# Patient Record
Sex: Male | Born: 1965 | Race: White | Hispanic: No | Marital: Married | Smoking: Never smoker
Health system: Southern US, Community
[De-identification: ages and names within clinical notes are randomized; demographics above are authoritative.]

## PROBLEM LIST (undated history)

## (undated) DIAGNOSIS — M109 Gout, unspecified: Secondary | ICD-10-CM

## (undated) DIAGNOSIS — M2550 Pain in unspecified joint: Secondary | ICD-10-CM

## (undated) HISTORY — DX: Pain in unspecified joint: M25.50

## (undated) HISTORY — PX: HEMORROIDECTOMY: SUR656

## (undated) HISTORY — PX: LITHOTRIPSY: SUR834

## (undated) HISTORY — DX: Gout, unspecified: M10.9

---

## 1999-11-18 ENCOUNTER — Emergency Department (HOSPITAL_COMMUNITY): Admission: EM | Admit: 1999-11-18 | Discharge: 1999-11-18 | Payer: Self-pay | Admitting: *Deleted

## 1999-11-18 ENCOUNTER — Encounter: Payer: Self-pay | Admitting: *Deleted

## 2000-07-01 ENCOUNTER — Encounter: Payer: Self-pay | Admitting: Neurosurgery

## 2000-07-01 ENCOUNTER — Encounter: Admission: RE | Admit: 2000-07-01 | Discharge: 2000-07-01 | Payer: Self-pay | Admitting: Neurosurgery

## 2004-02-11 ENCOUNTER — Ambulatory Visit (HOSPITAL_COMMUNITY): Admission: RE | Admit: 2004-02-11 | Discharge: 2004-02-11 | Payer: Self-pay | Admitting: Urology

## 2005-10-08 ENCOUNTER — Encounter: Admission: RE | Admit: 2005-10-08 | Discharge: 2005-10-08 | Payer: Self-pay | Admitting: Family Medicine

## 2006-09-16 ENCOUNTER — Encounter: Admission: RE | Admit: 2006-09-16 | Discharge: 2006-09-16 | Payer: Self-pay | Admitting: Family Medicine

## 2006-09-16 ENCOUNTER — Ambulatory Visit: Payer: Self-pay | Admitting: Family Medicine

## 2007-03-04 ENCOUNTER — Ambulatory Visit: Payer: Self-pay | Admitting: Family Medicine

## 2007-10-11 ENCOUNTER — Ambulatory Visit: Payer: Self-pay | Admitting: Family Medicine

## 2008-02-06 ENCOUNTER — Ambulatory Visit: Payer: Self-pay | Admitting: Family Medicine

## 2008-03-12 ENCOUNTER — Ambulatory Visit: Payer: Self-pay | Admitting: Family Medicine

## 2008-09-06 ENCOUNTER — Ambulatory Visit: Payer: Self-pay | Admitting: Family Medicine

## 2009-02-03 ENCOUNTER — Emergency Department (HOSPITAL_COMMUNITY): Admission: EM | Admit: 2009-02-03 | Discharge: 2009-02-03 | Payer: Self-pay | Admitting: Emergency Medicine

## 2010-05-29 ENCOUNTER — Encounter: Admission: RE | Admit: 2010-05-29 | Discharge: 2010-05-29 | Payer: Self-pay | Admitting: Internal Medicine

## 2011-04-01 IMAGING — CR DG FOOT COMPLETE 3+V*L*
3 series · 3 of 3 positions shown · non-contrast
Comparison: None.

CLINICAL DATA: Medial forefoot pain following injury 10 days ago.

LEFT FOOT - COMPLETE 3+ VIEW

[view not recorded (1 of 3)]
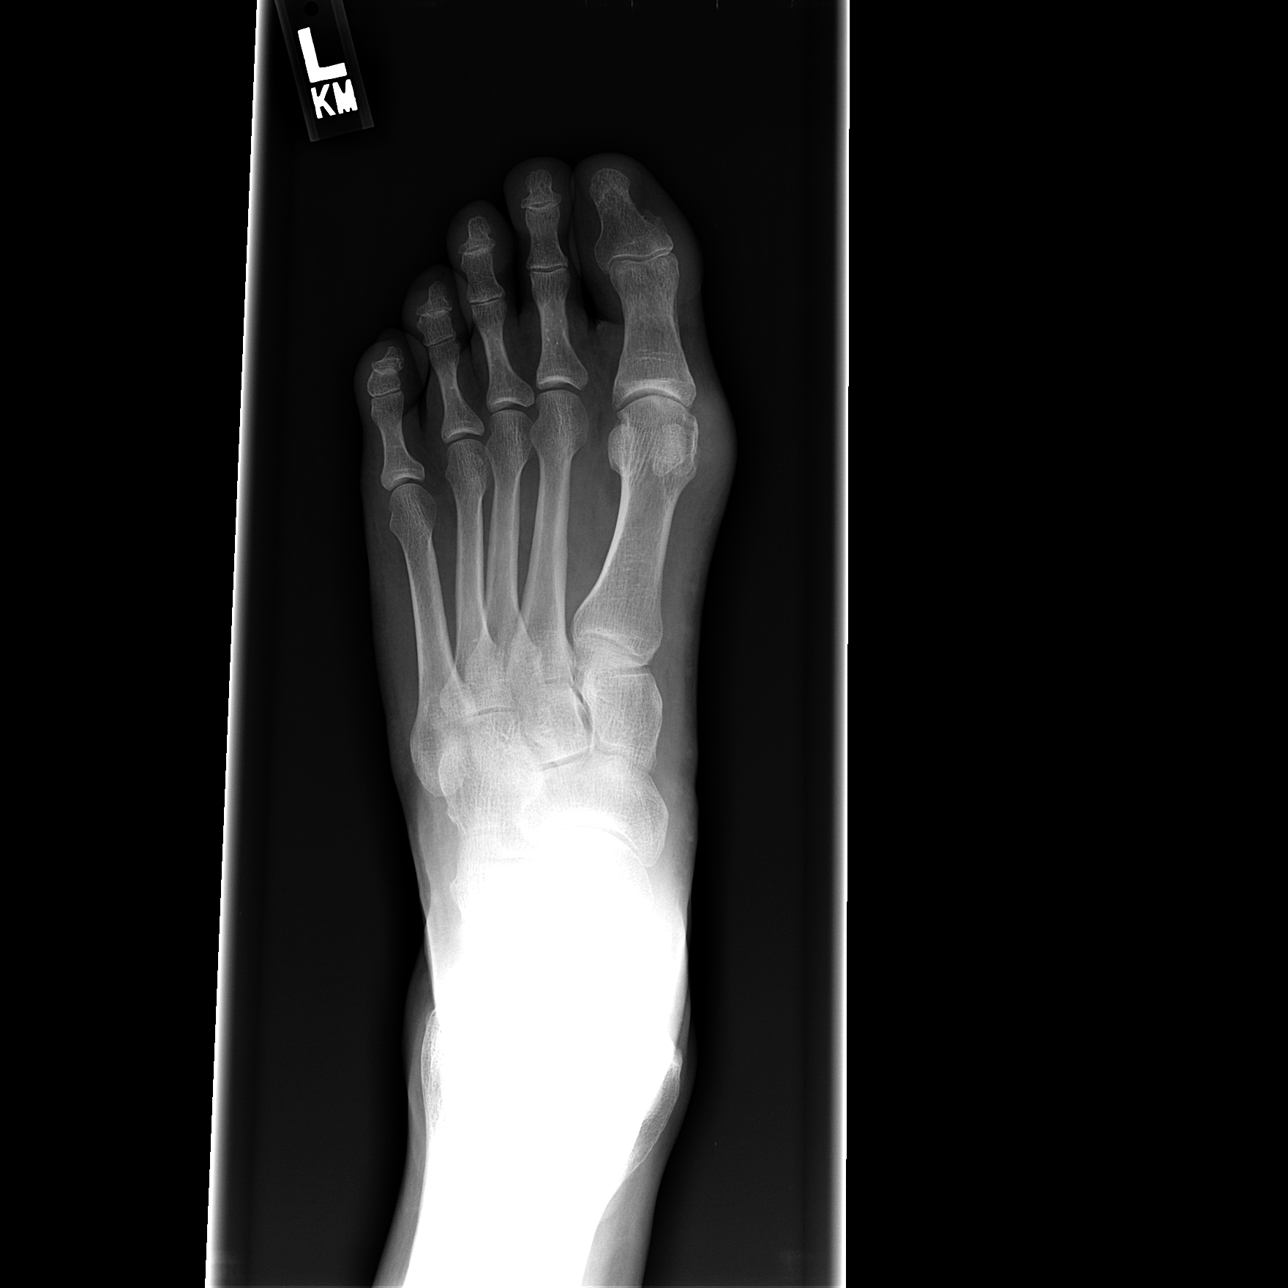

[view not recorded (2 of 3)]
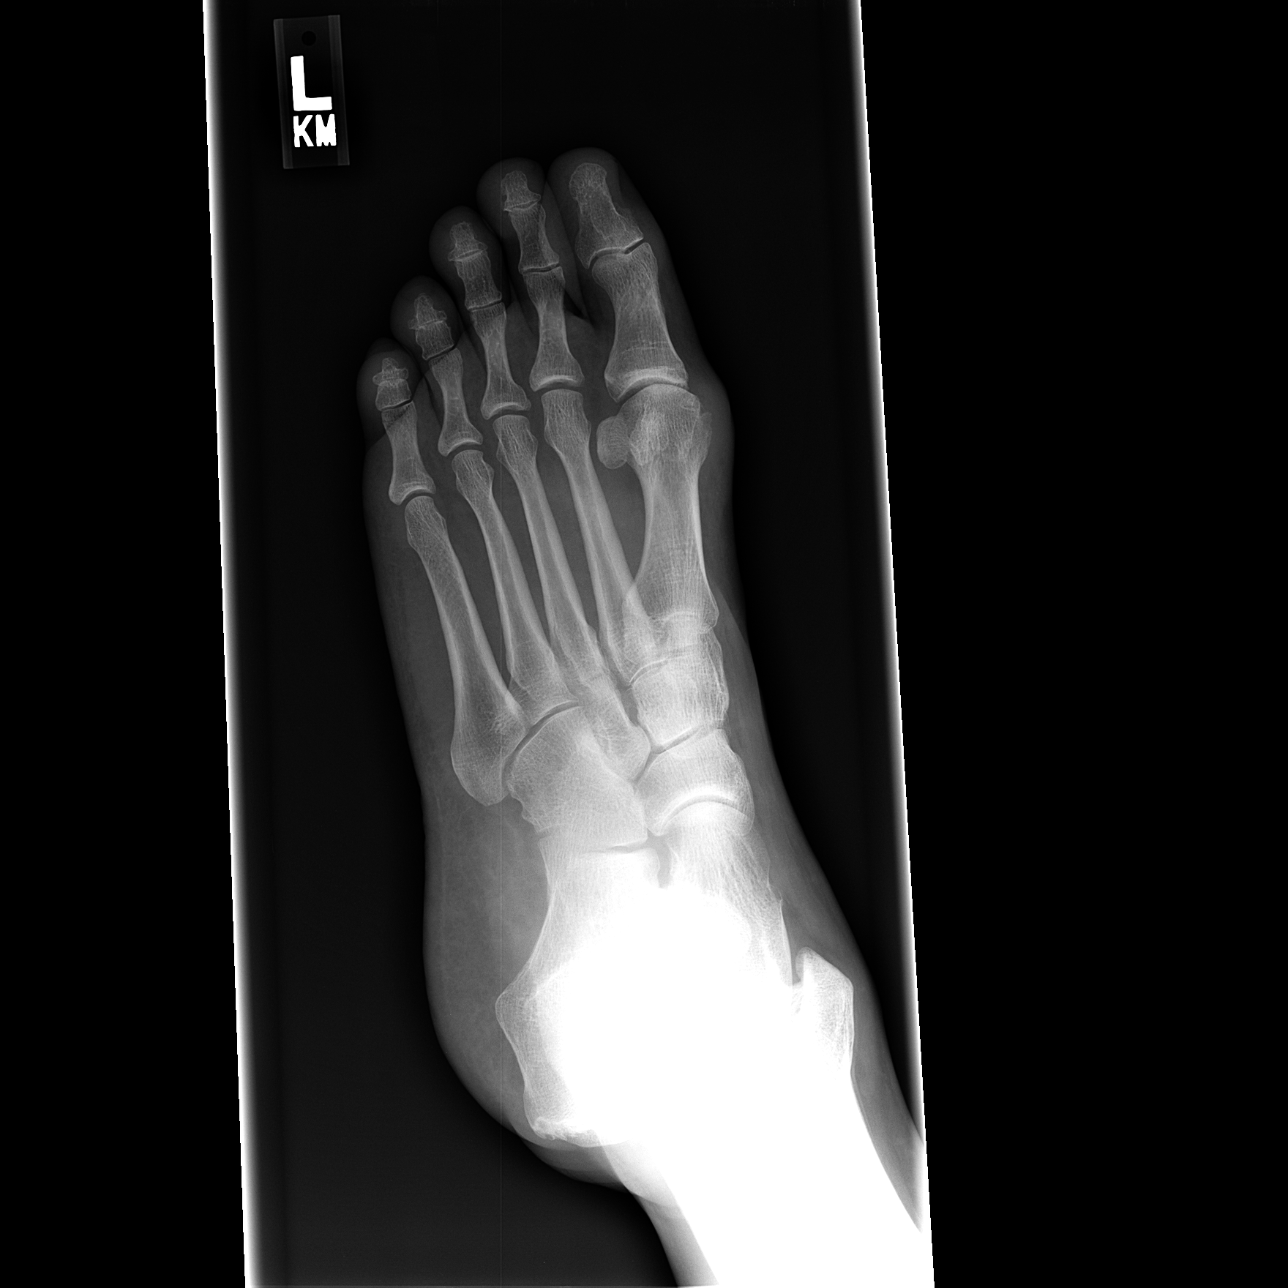

[view not recorded (3 of 3)]
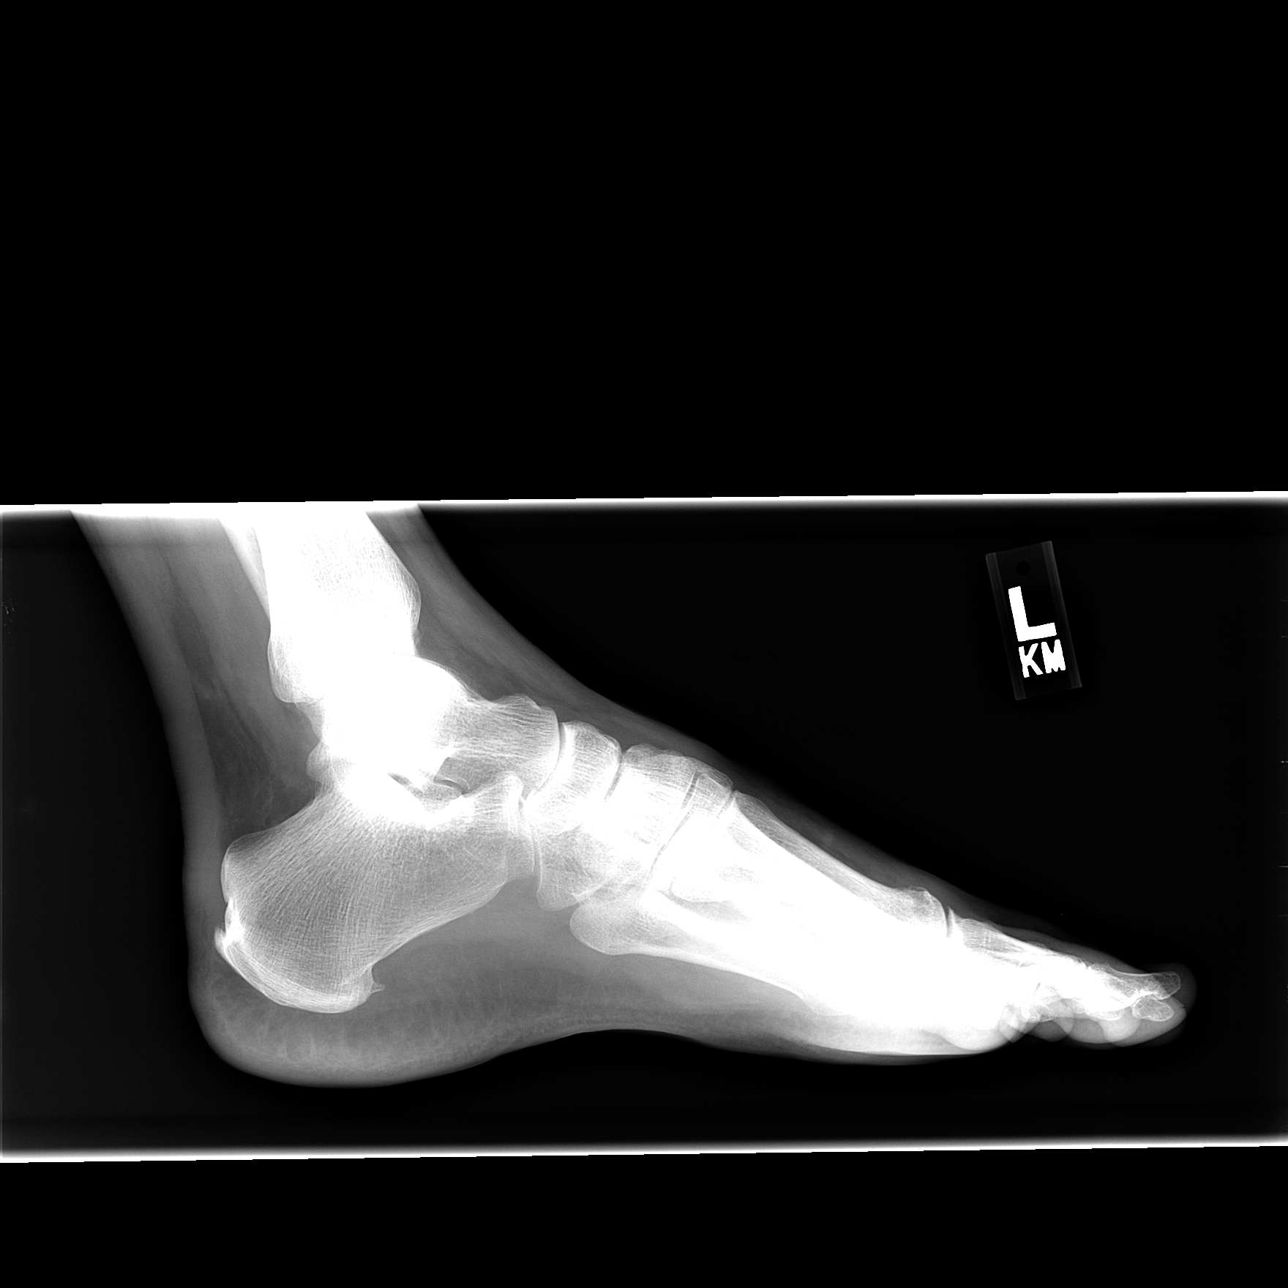

[3 of 3 positions shown; findings below may reference images not displayed]

FINDINGS: There is some soft tissue swelling medial to the first
metatarsal phalangeal joint.  That joint demonstrates mild
narrowing and osteophytes.  There is no evidence of acute fracture
or dislocation.  There are small calcaneal spurs.
IMPRESSION: Degenerative changes at the first metatarsal phalangeal joint with
possible overlying swelling or bunion.  No acute osseous findings.

## 2013-02-03 LAB — BASIC METABOLIC PANEL
BUN: 17 mg/dL (ref 4–21)
Creatinine: 1 mg/dL (ref 0.6–1.3)
Glucose: 94 mg/dL
POTASSIUM: 6.8 mmol/L — AB (ref 3.4–5.3)
Sodium: 142 mmol/L (ref 137–147)

## 2013-02-03 LAB — HEPATIC FUNCTION PANEL
ALT: 26 U/L (ref 10–40)
AST: 17 U/L (ref 14–40)
Alkaline Phosphatase: 64 U/L (ref 25–125)
Bilirubin, Total: 0.6 mg/dL

## 2013-02-03 LAB — CBC AND DIFFERENTIAL
HEMATOCRIT: 40 % — AB (ref 41–53)
Hemoglobin: 14.3 g/dL (ref 13.5–17.5)
PLATELETS: 234 10*3/uL (ref 150–399)
WBC: 7.6 10*3/mL

## 2013-02-07 ENCOUNTER — Telehealth: Payer: Self-pay | Admitting: Geriatric Medicine

## 2013-02-07 NOTE — Telephone Encounter (Signed)
Spoke with patient about his labs and asked him who his PCP is. He said he still comes here and he sees you. He just has not been in a while because he has not been sick. He said he went back on Friday May 2nd and had the labs drawn again and they all came back normal.

## 2015-05-07 ENCOUNTER — Ambulatory Visit (INDEPENDENT_AMBULATORY_CARE_PROVIDER_SITE_OTHER): Payer: Commercial Managed Care - HMO | Admitting: Nurse Practitioner

## 2015-05-07 ENCOUNTER — Encounter: Payer: Self-pay | Admitting: Nurse Practitioner

## 2015-05-07 VITALS — BP 118/86 | HR 63 | Temp 98.2°F | Ht 73.5 in | Wt 219.0 lb

## 2015-05-07 DIAGNOSIS — J209 Acute bronchitis, unspecified: Secondary | ICD-10-CM

## 2015-05-07 DIAGNOSIS — M109 Gout, unspecified: Secondary | ICD-10-CM

## 2015-05-07 MED ORDER — AZITHROMYCIN 250 MG PO TABS: ORAL_TABLET | ORAL | Status: DC

## 2015-05-07 MED ORDER — BENZONATATE 100 MG PO CAPS: 100.0000 mg | ORAL_CAPSULE | Freq: Three times a day (TID) | ORAL | Status: DC | PRN

## 2015-05-07 NOTE — Patient Instructions (Signed)

## 2015-05-07 NOTE — Progress Notes (Signed)
Patient ID: Jerry Swanson, male   DOB: 1965/11/14, 49 y.o.   MRN: [id number]    PCP: Sharon Seller, NP  No Known Allergies  Chief Complaint  Patient presents with  . Acute Visit    Not feeling well- ? bronchitis, patient c/o cough and body tepm changes     HPI: Patient is a 49 y.o. male seen in the office today due to not feeling well for about a week. Started feeling worse 3 days ago. Feeling some better, was taking some of his wife's left over antibiotics, she was previous on medication for same thing.  Cough,chest congestion, throat hurts head hurts. Hoarse.  Hot flashes and chills. No known fevers but has not checked temp.  Coughing up some sputum. No shortness of breath.  Has not taken anything else except for antibiotics mucinex does not work for him- cough is bad at night  Following with Dr Corliss Skains Rheumatologist every 6 months were he gets lab work done and follow up on gout  Review of Systems:  Review of Systems  Constitutional: Positive for fatigue. Negative for activity change, appetite change and unexpected weight change.  HENT: Positive for congestion and sore throat. Negative for hearing loss and sinus pressure.   Eyes: Negative.   Respiratory: Negative for cough and shortness of breath.   Cardiovascular: Negative for chest pain, palpitations and leg swelling.  Neurological: Negative for dizziness and light-headedness.    Past Medical History  Diagnosis Date  . Gout   . Joint pain    Past Surgical History  Procedure Laterality Date  . Hemorroidectomy      x 2  . Lithotripsy      x 2   Social History:   reports that he has never smoked. He does not have any smokeless tobacco history on file. He reports that he does not drink alcohol or use illicit drugs.  Family History  Problem Relation Age of Onset  . Diabetes Father   . Kidney failure Father   . Diabetes Mother   . Asthma Mother   . Arthritis Mother     Medications: Patient's Medications    New Prescriptions   No medications on file  Previous Medications   FEBUXOSTAT (ULORIC) 80 MG TABS    Take by mouth. 1/2 by mouth every other day  Modified Medications   No medications on file  Discontinued Medications   No medications on file     Physical Exam:  Filed Vitals:   05/07/15 1538  BP: 118/86  Pulse: 63  Temp: 98.2 F (36.8 C)  TempSrc: Oral  Height: 6' 1.5" (1.867 m)  Weight: 219 lb (99.338 kg)  SpO2: 97%    Physical Exam  Constitutional: He is oriented to person, place, and time. He appears well-developed and well-nourished. No distress.  HENT:  Head: Normocephalic and atraumatic.  Mouth/Throat: Oropharynx is clear and moist. No oropharyngeal exudate.  Eyes: Conjunctivae and EOM are normal. Pupils are equal, round, and reactive to light.  Neck: Normal range of motion. Neck supple. No thyromegaly present.  Cardiovascular: Normal rate, regular rhythm and normal heart sounds.   Pulmonary/Chest: Effort normal and breath sounds normal.  Lymphadenopathy:    He has no cervical adenopathy.  Neurological: He is alert and oriented to person, place, and time.  Skin: Skin is warm and dry. He is not diaphoretic.  Psychiatric: He has a normal mood and affect.    Labs reviewed: Basic Metabolic Panel: No results for input(s): NA,  K, CL, CO2, GLUCOSE, BUN, CREATININE, CALCIUM, MG, PHOS, TSH in the last 8760 hours. Liver Function Tests: No results for input(s): AST, ALT, ALKPHOS, BILITOT, PROT, ALBUMIN in the last 8760 hours. No results for input(s): LIPASE, AMYLASE in the last 8760 hours. No results for input(s): AMMONIA in the last 8760 hours. CBC: No results for input(s): WBC, NEUTROABS, HGB, HCT, MCV, PLT in the last 8760 hours. Lipid Panel: No results for input(s): CHOL, HDL, LDLCALC, TRIG, CHOLHDL, LDLDIRECT in the last 8760 hours. TSH: No results for input(s): TSH in the last 8760 hours. A1C: No results found for: HGBA1C   Assessment/Plan 1. Acute  bronchitis, unspecified organism encouraged hydration -discussed proper use of antibiotics and to complete all medication prescribed - azithromycin (ZITHROMAX Z-PAK) 250 MG tablet; 2 tablets today, 1 tablet daily until complete.  Dispense: 6 each; Refill: 0 - benzonatate (TESSALON PERLES) 100 MG capsule; Take 1 capsule (100 mg total) by mouth 3 (three) times daily as needed for cough.  Dispense: 20 capsule; Refill: 0  2. Gout - following with dr deveshwar rheumatologist, getting routine blood work there, to send [place] records  recommended yearly physical  Janene Harvey. Biagio Borg  [facility] [phone] 8 am - 5 pm) [phone] (after hours)

## 2016-04-13 ENCOUNTER — Ambulatory Visit (INDEPENDENT_AMBULATORY_CARE_PROVIDER_SITE_OTHER): Payer: Commercial Managed Care - HMO | Admitting: Nurse Practitioner

## 2016-04-13 ENCOUNTER — Encounter: Payer: Self-pay | Admitting: Nurse Practitioner

## 2016-04-13 VITALS — BP 118/86 | HR 78 | Temp 97.9°F | Resp 17 | Ht 73.0 in | Wt 210.6 lb

## 2016-04-13 DIAGNOSIS — R1012 Left upper quadrant pain: Secondary | ICD-10-CM | POA: Diagnosis not present

## 2016-04-13 DIAGNOSIS — R300 Dysuria: Secondary | ICD-10-CM

## 2016-04-13 LAB — POCT URINALYSIS DIPSTICK
BILIRUBIN UA: NEGATIVE
GLUCOSE UA: NEGATIVE
KETONES UA: NEGATIVE
Leukocytes, UA: NEGATIVE
NITRITE UA: NEGATIVE
Protein, UA: NEGATIVE
Spec Grav, UA: 1.01
Urobilinogen, UA: 0.2
pH, UA: 5

## 2016-04-13 MED ORDER — TAMSULOSIN HCL 0.4 MG PO CAPS: 0.4000 mg | ORAL_CAPSULE | Freq: Every day | ORAL | Status: AC

## 2016-04-13 MED ORDER — HYDROCODONE-ACETAMINOPHEN 5-325 MG PO TABS: 1.0000 | ORAL_TABLET | Freq: Four times a day (QID) | ORAL | Status: AC | PRN

## 2016-04-13 NOTE — Patient Instructions (Signed)
To increase water intake  To use strainer when urinates To take flomax 0.4 mg daily to help urination  Will get urology referral   To use pain medication as needed

## 2016-04-13 NOTE — Progress Notes (Signed)
Patient ID: Jerry Swanson, male   DOB: 04-15-1966, 50 y.o.   MRN: [id number]    PCP: Sharon Seller, NP  Advanced Directive information Does patient have an advance directive?: No, Would patient like information on creating an advanced directive?: No - patient declined information  No Known Allergies  Chief Complaint  Patient presents with  . Acute Visit    N&V since this morning. Abdominal pain feels like kidney stone but possible UTI.      HPI: Patient is a 50 y.o. male seen in the office today due to nausea, vomiting and abdominal pain. Pt with hx of gout. Having abdominal pain and back pain  Pt with hx of kidney stone and feeling similar.  Last kidney stone was 5 years ago.  2 weeks ago was peeing red and thought "uh oh" but it cleared up.  This morning his back was very sore higher up then he felt a sharp pain. Unable to drive so wife brought him to appt.  10/10 sharp stabbing, grabbing pain when pain hits, pain makes him vomit, no nausea  Now he is not having any pain.  No blood in urine now, a little dark.  No fevers But having chills  Review of Systems:  Review of Systems  Constitutional: Positive for chills, appetite change and unexpected weight change. Negative for fever, activity change and fatigue.  Respiratory: Negative for shortness of breath.   Cardiovascular: Negative for chest pain, palpitations and leg swelling.  Gastrointestinal: Positive for abdominal pain. Negative for diarrhea, constipation, blood in stool and abdominal distention.  Genitourinary: Positive for dysuria, frequency, hematuria and flank pain. Negative for discharge, difficulty urinating, genital sores and penile pain.  Musculoskeletal: Positive for back pain.    Past Medical History  Diagnosis Date  . Gout   . Joint pain    Past Surgical History  Procedure Laterality Date  . Hemorroidectomy      x 2  . Lithotripsy      x 2   Social History:   reports that he has never smoked. He  uses smokeless tobacco. He reports that he does not drink alcohol or use illicit drugs.  Family History  Problem Relation Age of Onset  . Diabetes Father   . Kidney failure Father   . Diabetes Mother   . Asthma Mother   . Arthritis Mother     Medications: Patient's Medications  New Prescriptions   No medications on file  Previous Medications   FEBUXOSTAT (ULORIC) 80 MG TABS    Take by mouth. 1/2 by mouth every other day   IBUPROFEN (ADVIL,MOTRIN) 200 MG TABLET    Take 200 mg by mouth every 6 (six) hours as needed.   RANITIDINE (ZANTAC) 150 MG TABLET    Take 150 mg by mouth daily.  Modified Medications   No medications on file  Discontinued Medications   AZITHROMYCIN (ZITHROMAX Z-PAK) 250 MG TABLET    2 tablets today, 1 tablet daily until complete.   BENZONATATE (TESSALON PERLES) 100 MG CAPSULE    Take 1 capsule (100 mg total) by mouth 3 (three) times daily as needed for cough.     Physical Exam:  Filed Vitals:   04/13/16 1337  BP: 118/86  Pulse: 78  Temp: 97.9 F (36.6 C)  TempSrc: Oral  Resp: 17  Height:  (1.854 m)  Weight: 210 lb 9.6 oz (95.528 kg)  SpO2: 98%   Body mass index is 27.79 kg/(m^2).  Physical Exam  Constitutional: He is oriented to person, place, and time. He appears well-developed and well-nourished. No distress.  Neck: Neck supple.  Cardiovascular: Normal rate, regular rhythm and normal heart sounds.   Pulmonary/Chest: Effort normal and breath sounds normal.  Abdominal: Soft. Bowel sounds are normal. He exhibits no distension and no mass. There is no tenderness. There is no rebound and no guarding.  Neurological: He is alert and oriented to person, place, and time.  Skin: Skin is warm and dry. He is not diaphoretic.  Psychiatric: He has a normal mood and affect.    Labs reviewed: Basic Metabolic Panel: No results for input(s): NA, K, CL, CO2, GLUCOSE, BUN, CREATININE, CALCIUM, MG, PHOS, TSH in the last 8760 hours. Liver Function  Tests: No results for input(s): AST, ALT, ALKPHOS, BILITOT, PROT, ALBUMIN in the last 8760 hours. No results for input(s): LIPASE, AMYLASE in the last 8760 hours. No results for input(s): AMMONIA in the last 8760 hours. CBC: No results for input(s): WBC, NEUTROABS, HGB, HCT, MCV, PLT in the last 8760 hours. Lipid Panel: No results for input(s): CHOL, HDL, LDLCALC, TRIG, CHOLHDL, LDLDIRECT in the last 8760 hours. TSH: No results for input(s): TSH in the last 8760 hours. A1C: No results found for: HGBA1C   Assessment/Plan 1. Dysuria - POCT urinalysis dipstick- pos for blood  2. Left upper quadrant pain - most likely kidney stone based on pts history - US Renal- to evaluate  -to obtain a strainer for urine -to increase water intake - tamsulosin (FLOMAX) 0.4 MG CAPS capsule; Take 1 capsule (0.4 mg total) by mouth daily.  Dispense: 30 capsule; Refill: 1 - Ambulatory referral to -- previously saw Dr Isabel CapriceGrapey  - HYDROcodone-acetaminophen (NORCO/VICODIN) 5-325 MG tablet; Take 1-2 tablets by mouth every 6 (six) hours as needed.  Dispense: 30 tablet; Refill: 0  Pt to schedule physical for preventative care  Leelyn Jasinski K. Biagio BorgEubanks, AGNP  [facility] [phone](Monday-Friday 8 am - 5 pm) [phone] (after hours)

## 2016-04-14 ENCOUNTER — Ambulatory Visit (HOSPITAL_COMMUNITY): Payer: Commercial Managed Care - HMO

## 2016-05-13 ENCOUNTER — Telehealth: Payer: Self-pay | Admitting: *Deleted

## 2016-05-13 NOTE — Telephone Encounter (Signed)
Patient has a Sinus infection and head is all stopped up and sore throat. Patient has had this in the past and a Z-pac clears it up. No available appointment for today, patient would like to know if a Z-pac could be called in for him. Please Advise.

## 2016-05-13 NOTE — Telephone Encounter (Signed)
It is okay to send a prescription for azithromycin 250 mg (6 tablets) take 2 on the first day and then 1 daily thereafter for infection.

## 2016-05-14 NOTE — Telephone Encounter (Signed)
Patient did myChart Dr. Earlean Shawlnline. Does not need at this time.

## 2016-05-20 ENCOUNTER — Other Ambulatory Visit: Payer: Self-pay | Admitting: *Deleted

## 2016-05-20 MED ORDER — AZITHROMYCIN 250 MG PO TABS: ORAL_TABLET | ORAL | 0 refills | Status: AC

## 2016-07-09 ENCOUNTER — Ambulatory Visit (INDEPENDENT_AMBULATORY_CARE_PROVIDER_SITE_OTHER): Payer: Commercial Managed Care - HMO | Admitting: Rheumatology

## 2016-07-09 DIAGNOSIS — M19041 Primary osteoarthritis, right hand: Secondary | ICD-10-CM | POA: Diagnosis not present

## 2016-07-09 DIAGNOSIS — M19071 Primary osteoarthritis, right ankle and foot: Secondary | ICD-10-CM

## 2016-07-09 DIAGNOSIS — M1A00X0 Idiopathic chronic gout, unspecified site, without tophus (tophi): Secondary | ICD-10-CM

## 2016-11-16 ENCOUNTER — Ambulatory Visit (INDEPENDENT_AMBULATORY_CARE_PROVIDER_SITE_OTHER): Payer: Commercial Managed Care - HMO | Admitting: Rheumatology

## 2016-11-16 ENCOUNTER — Encounter: Payer: Self-pay | Admitting: Rheumatology

## 2016-11-16 VITALS — BP 119/91 | HR 68 | Resp 12 | Ht 73.0 in | Wt 215.0 lb

## 2016-11-16 DIAGNOSIS — M19041 Primary osteoarthritis, right hand: Secondary | ICD-10-CM | POA: Diagnosis not present

## 2016-11-16 DIAGNOSIS — M19042 Primary osteoarthritis, left hand: Secondary | ICD-10-CM

## 2016-11-16 DIAGNOSIS — M5030 Other cervical disc degeneration, unspecified cervical region: Secondary | ICD-10-CM

## 2016-11-16 DIAGNOSIS — M47812 Spondylosis without myelopathy or radiculopathy, cervical region: Secondary | ICD-10-CM

## 2016-11-16 DIAGNOSIS — M47816 Spondylosis without myelopathy or radiculopathy, lumbar region: Secondary | ICD-10-CM | POA: Insufficient documentation

## 2016-11-16 DIAGNOSIS — M1A09X0 Idiopathic chronic gout, multiple sites, without tophus (tophi): Secondary | ICD-10-CM | POA: Insufficient documentation

## 2016-11-16 DIAGNOSIS — M19072 Primary osteoarthritis, left ankle and foot: Secondary | ICD-10-CM

## 2016-11-16 DIAGNOSIS — M19071 Primary osteoarthritis, right ankle and foot: Secondary | ICD-10-CM

## 2016-11-16 DIAGNOSIS — M170 Bilateral primary osteoarthritis of knee: Secondary | ICD-10-CM | POA: Diagnosis not present

## 2016-11-16 LAB — COMPLETE METABOLIC PANEL WITH GFR
ALBUMIN: 4.5 g/dL (ref 3.6–5.1)
ALK PHOS: 78 U/L (ref 40–115)
ALT: 31 U/L (ref 9–46)
AST: 21 U/L (ref 10–35)
BILIRUBIN TOTAL: 0.6 mg/dL (ref 0.2–1.2)
BUN: 12 mg/dL (ref 7–25)
CO2: 26 mmol/L (ref 20–31)
Calcium: 9.7 mg/dL (ref 8.6–10.3)
Chloride: 105 mmol/L (ref 98–110)
Creat: 1.21 mg/dL (ref 0.70–1.33)
GFR, EST AFRICAN AMERICAN: 80 mL/min (ref >=60)
GFR, EST NON AFRICAN AMERICAN: 69 mL/min (ref >=60)
Glucose, Bld: 79 mg/dL (ref 65–99)
Potassium: 4.4 mmol/L (ref 3.5–5.3)
Sodium: 140 mmol/L (ref 135–146)
TOTAL PROTEIN: 7.4 g/dL (ref 6.1–8.1)

## 2016-11-16 LAB — CBC WITH DIFFERENTIAL/PLATELET
BASOS ABS: 81 {cells}/uL (ref 0–200)
Basophils Relative: 1 %
EOS ABS: 486 {cells}/uL (ref 15–500)
Eosinophils Relative: 6 %
HCT: 44.9 % (ref 38.5–50.0)
HEMOGLOBIN: 15.4 g/dL (ref 13.2–17.1)
LYMPHS ABS: 2430 {cells}/uL (ref 850–3900)
Lymphocytes Relative: 30 %
MCH: 30.9 pg (ref 27.0–33.0)
MCHC: 34.3 g/dL (ref 32.0–36.0)
MCV: 90.2 fL (ref 80.0–100.0)
MONOS PCT: 5 %
MPV: 8.8 fL (ref 7.5–12.5)
Monocytes Absolute: 405 cells/uL (ref 200–950)
NEUTROS ABS: 4698 {cells}/uL (ref 1500–7800)
NEUTROS PCT: 58 %
Platelets: 248 10*3/uL (ref 140–400)
RBC: 4.98 MIL/uL (ref 4.20–5.80)
RDW: 13.8 % (ref 11.0–15.0)
WBC: 8.1 10*3/uL (ref 3.8–10.8)

## 2016-11-16 MED ORDER — COLCHICINE 0.6 MG PO CAPS: 0.6000 mg | ORAL_CAPSULE | Freq: Every day | ORAL | 0 refills | Status: AC | PRN

## 2016-11-16 MED ORDER — FEBUXOSTAT 80 MG PO TABS: 40.0000 mg | ORAL_TABLET | Freq: Every day | ORAL | 1 refills | Status: AC

## 2016-11-16 MED ORDER — FEBUXOSTAT 80 MG PO TABS: 40.0000 mg | ORAL_TABLET | ORAL | 0 refills | Status: AC

## 2016-11-16 NOTE — Patient Instructions (Signed)
Supplements for OA Natural anti-inflammatories  You can purchase these at [employer], [employer] or online.  . Turmeric (capsules)  . Ginger (ginger root or capsules)  . Omega 3 (Fish, flax seeds, chia seeds, walnuts, almonds)  . Tart cherry (dried or extract)   Patient should be under the care of a physician while taking these supplements. This may not be reproduced without the permission of Dr. Shaili Deveshwar.  

## 2016-11-16 NOTE — Progress Notes (Signed)
Office Visit Note  Patient: Jerry GandyJohn D Swanson             Date of Birth: 01/10/1966           MRN: [id number]             PCP: Jerry Swanson Referring: Jerry Swanson Visit Date: 11/16/2016 Occupation: @GUAROCC @    Subjective:  Follow-up Follow-up on gout and OA of hands and feet  History of Present Illness: Jerry SkeetersJohn D Swanson is a 51 y.o. male  Last seen 07/09/2016 Patient states that the gout is doing well. No flare. Taking U Lori 40 mg every other day. His last uric acid was 7. no back in April 2017. We do not have any updated uric acid levels or labs on the patient which will remedy today.  Patient has a need for refill of his medications. He does have colcrys at home but he thinks that it is expired.  He does not want to spend $300 at lab medicines that at home and wants to know if we have samples. I offered him Medicare samples and patient is appreciated.   Patient is complaining of left foot pain on the medial aspect of the foot near the calcaneus. He had inserts for his shoes in the past and they're now 51 years old and he is aware that he should get those updated at the shoe market. They have served him well in the past.  Patient also states in in August 2017, he had bronchitis. Almost concurrently, he had kidney stones in August. This was a very bad month for him according to the patient.  No other complaint. [place] [facility] [place] [place] 20 days.  Activities of Daily Living:  Patient reports morning stiffness for 15 minutes.   Patient Reports nocturnal pain ( to bilateral feet). Difficulty dressing/grooming: Denies Difficulty climbing stairs: Denies Difficulty getting out of chair: Denies Difficulty using hands for taps, buttons, cutlery, and/or writing: Denies   Review of Systems  Constitutional: Negative for fatigue.  HENT: Negative for mouth sores and mouth dryness.   Eyes: Negative for dryness.  Respiratory: Negative  for shortness of breath.   Gastrointestinal: Negative for constipation and diarrhea.  Musculoskeletal: Negative for myalgias and myalgias.  Skin: Negative for sensitivity to sunlight.  Neurological: Negative for memory loss.  Psychiatric/Behavioral: Negative for sleep disturbance.    PMFS History:  Patient Active Problem List   Diagnosis Date Noted  . Idiopathic chronic gout of multiple sites without tophus 11/16/2016  . Primary osteoarthritis of both knees 11/16/2016  . Primary osteoarthritis of both hands 11/16/2016  . Primary osteoarthritis of both feet 11/16/2016  . DJD (degenerative joint disease), cervical 11/16/2016  . Spondylosis of lumbar region without myelopathy or radiculopathy 11/16/2016    Past Medical History:  Diagnosis Date  . Gout   . Joint pain     Family History  Problem Relation Age of Onset  . Diabetes Father   . Kidney failure Father   . Diabetes Mother   . Asthma Mother   . Arthritis Mother    Past Surgical History:  Procedure Laterality Date  . HEMORROIDECTOMY     x 2  . LITHOTRIPSY     x 2   Social History   Social History Narrative  . No narrative on file     Objective: Vital Signs: BP (!) 119/91   Pulse 68   Resp 12  Ht 6\' 1"  (1.854 m)   Wt 215 lb (97.5 kg)   BMI 28.37 kg/m    Physical Exam  Constitutional: He is oriented to person, place, and time. He appears well-developed and well-nourished.  HENT:  Head: Normocephalic and atraumatic.  Eyes: Conjunctivae and EOM are normal. Pupils are equal, round, and reactive to light.  Neck: Normal range of motion. Neck supple.  Cardiovascular: Normal rate, regular rhythm and normal heart sounds.  Exam reveals no gallop and no friction rub.   No murmur heard. Pulmonary/Chest: Effort normal and breath sounds normal. No respiratory distress. He has no wheezes. He has no rales. He exhibits no tenderness.  Abdominal: Soft. He exhibits no distension and no mass. There is no tenderness. There  is no guarding.  Musculoskeletal: Normal range of motion.  Lymphadenopathy:    He has no cervical adenopathy.  Neurological: He is alert and oriented to person, place, and time. He exhibits normal muscle tone. Coordination normal.  Skin: Skin is warm and dry. Capillary refill takes less than 2 seconds. No rash noted.  Psychiatric: He has a normal mood and affect. His behavior is normal. Judgment and thought content normal.  Nursing note and vitals reviewed.    Musculoskeletal Exam:  Full range of motion of all joints Grip strength is equal and strong bilaterally Fibromyalgia tender points are all absent  CDAI Exam: CDAI Homunculus Exam:   Joint Counts:  CDAI Tender Joint count: 0 CDAI Swollen Joint count: 0  Global Assessments:  Patient Global Assessment: 0 Provider Global Assessment: 0  No synovitis on examination  Investigation: Findings:  07/10/2016 CBC CMP normal Uric acid 6.3  No visits with results within 6 Month(s) from this visit.  Latest known visit with results is:  Office Visit on 04/13/2016  Component Date Value Ref Range Status  . Color, UA 04/13/2016 dark yellow   Final  . Clarity, UA 04/13/2016 cloudy   Final  . Glucose, UA 04/13/2016 negative   Final  . Bilirubin, UA 04/13/2016 negative   Final  . Ketones, UA 04/13/2016 negative   Final  . Spec Grav, UA 04/13/2016 1.010   Final  . Blood, UA 04/13/2016 large   Final  . pH, UA 04/13/2016 5.0   Final  . Protein, UA 04/13/2016 negative   Final  . Urobilinogen, UA 04/13/2016 0.2   Final  . Nitrite, UA 04/13/2016 negative   Final  . Leukocytes, UA 04/13/2016 Negative  Negative Final     Imaging: No results found.  Speciality Comments: No specialty comments available.    Procedures:  No procedures performed Allergies: Patient has no known allergies.   Assessment / Plan:     Visit Diagnoses: Idiopathic chronic gout of multiple sites without tophus - Plan: CBC with Differential/Platelet, COMPLETE  METABOLIC PANEL WITH GFR, Uric acid  Primary osteoarthritis of both knees  Primary osteoarthritis of both hands  Primary osteoarthritis of both feet  DJD (degenerative joint disease), cervical  Spondylosis of lumbar region without myelopathy or radiculopathy   Plan: #1: Gout. No flare. Has not had a flare in more than a year. Taking ULoric 40 mg every other day. He does find the medication expensive and is requesting a sample. We were able to spare 4 bottles with 7 tablets in each bottle of 80 mg Uloric  #2: CBC with differential, CMP with GFR, uric acid The last time we checked this lab levels were April 2017.  #3: Bilateral feet with OA. The left foot has  pain in the medial aspect of the calcaneal area. He has had orthotics in the past which have helped him. The last time he had inserts or 2 years ago from [employer] [facility] [employer].  #4: Patient is moving to [facility] in about 20 days. He still wants to come to our office for rheumatological care.  #5: History of bilateral knee OA and status post Visco supplementation in the past. Currently he is having mild discomfort but he does not need any Visco supplementation intervention at this time. He is aware to call us and let us know when we need to reapply  Orders: Orders Placed This Encounter  Procedures  . CBC with Differential/Platelet  . COMPLETE METABOLIC PANEL WITH GFR  . Uric acid   Meds ordered this encounter  Medications  . Febuxostat (ULORIC) 80 MG TABS    Sig: Take 0.5 tablets (40 mg total) by mouth daily. 1/2 by mouth every other day Take by mouth.    Dispense:  90 tablet    Refill:  1    Order Specific Question:   Supervising Provider    AnswerTawni Pummel [id number]  . Colchicine (MITIGARE) 0.6 MG CAPS    Sig: Take 0.6 mg by mouth daily as needed.    Dispense:  15 capsule    Refill:  0    Order Specific Question:   Supervising Provider    AnswerTawni Pummel [id number]    . Febuxostat 80 MG TABS    Sig: Take 0.5 tablets (40 mg total) by mouth every other day.    Dispense:  30 tablet    Refill:  0    Order Specific Question:   Supervising Provider    Answer:   Tawni Pummel [phone]    Face-to-face time spent with patient was 30 minutes. 50% of time was spent in counseling and coordination of care.  Follow-Up Instructions: Return in about 8 months (around 07/16/2017) for gout, oa hands, feet pain, .   Tawni Pummel, PA-C  Note - This record has been created using [employer].  Chart creation errors have been sought, but may not always  have been located. Such creation errors do not reflect on  the standard of medical care.

## 2016-11-17 LAB — URIC ACID: URIC ACID, SERUM: 6.4 mg/dL (ref 4.0–8.0)

## 2016-11-19 ENCOUNTER — Telehealth: Payer: Self-pay

## 2016-11-19 NOTE — Telephone Encounter (Signed)
I called patient to see about scheduling an appointment for a physical and for fasting lab work. I left a message for patient to call the office.

## 2017-03-23 ENCOUNTER — Telehealth (INDEPENDENT_AMBULATORY_CARE_PROVIDER_SITE_OTHER): Payer: Self-pay

## 2017-03-23 NOTE — Telephone Encounter (Signed)
Patient wife would like a CD of patient's x-rays.

## 2017-03-25 NOTE — Telephone Encounter (Signed)
Spoke with Jerry RuizJohn and notified him the disc would be made and ready for pick up a the front desk.

## 2017-07-13 ENCOUNTER — Telehealth: Payer: Self-pay | Admitting: Rheumatology

## 2017-07-13 DIAGNOSIS — M19041 Primary osteoarthritis, right hand: Secondary | ICD-10-CM

## 2017-07-13 DIAGNOSIS — M19072 Primary osteoarthritis, left ankle and foot: Secondary | ICD-10-CM

## 2017-07-13 DIAGNOSIS — M19042 Primary osteoarthritis, left hand: Secondary | ICD-10-CM

## 2017-07-13 DIAGNOSIS — M19071 Primary osteoarthritis, right ankle and foot: Secondary | ICD-10-CM

## 2017-07-13 DIAGNOSIS — M170 Bilateral primary osteoarthritis of knee: Secondary | ICD-10-CM

## 2017-07-13 DIAGNOSIS — M1A09X0 Idiopathic chronic gout, multiple sites, without tophus (tophi): Secondary | ICD-10-CM

## 2017-07-13 DIAGNOSIS — M47812 Spondylosis without myelopathy or radiculopathy, cervical region: Secondary | ICD-10-CM

## 2017-07-13 NOTE — Telephone Encounter (Signed)
yes

## 2017-07-13 NOTE — Telephone Encounter (Signed)
Ok to refer patient to [facility] in [place] ? He has moved

## 2017-07-13 NOTE — Telephone Encounter (Addendum)
Patients wife calling requesting a referral for patient to see another Rheumatologist near [employer]. Patient has moved, and needs a doctor near him.  [facility] [address] [place] [address] Marcy Panning, [place] Please call wife with questions. Patient's wife to fax over a referral form from [facility]ryn as soon as she gets it that has to be filled out from [place].

## 2017-07-14 NOTE — Telephone Encounter (Signed)
Referral placed.

## 2017-07-19 ENCOUNTER — Ambulatory Visit: Payer: Commercial Managed Care - HMO | Admitting: Rheumatology

## 2021-01-24 ENCOUNTER — Other Ambulatory Visit (HOSPITAL_COMMUNITY): Payer: Self-pay
